# Patient Record
Sex: Male | Born: 1975 | Race: White | Hispanic: No | Marital: Single | Smoking: Never smoker
Health system: Southern US, Community
[De-identification: ages and names within clinical notes are randomized; demographics above are authoritative.]

## PROBLEM LIST (undated history)

## (undated) DIAGNOSIS — F329 Major depressive disorder, single episode, unspecified: Secondary | ICD-10-CM

## (undated) DIAGNOSIS — E785 Hyperlipidemia, unspecified: Secondary | ICD-10-CM

## (undated) DIAGNOSIS — F32A Depression, unspecified: Secondary | ICD-10-CM

## (undated) DIAGNOSIS — L729 Follicular cyst of the skin and subcutaneous tissue, unspecified: Secondary | ICD-10-CM

## (undated) HISTORY — DX: Major depressive disorder, single episode, unspecified: F32.9

## (undated) HISTORY — DX: Hyperlipidemia, unspecified: E78.5

## (undated) HISTORY — DX: Follicular cyst of the skin and subcutaneous tissue, unspecified: L72.9

## (undated) HISTORY — PX: CYSTECTOMY: SUR359

## (undated) HISTORY — DX: Depression, unspecified: F32.A

---

## 2012-04-02 ENCOUNTER — Encounter (INDEPENDENT_AMBULATORY_CARE_PROVIDER_SITE_OTHER): Payer: Self-pay | Admitting: Surgery

## 2012-04-21 ENCOUNTER — Ambulatory Visit (INDEPENDENT_AMBULATORY_CARE_PROVIDER_SITE_OTHER): Payer: BC Managed Care – PPO | Admitting: Surgery

## 2012-04-21 ENCOUNTER — Encounter (INDEPENDENT_AMBULATORY_CARE_PROVIDER_SITE_OTHER): Payer: Self-pay | Admitting: Surgery

## 2012-04-21 VITALS — BP 128/88 | HR 64 | Temp 98.8°F | Resp 14 | Ht 70.0 in | Wt 183.6 lb

## 2012-04-21 DIAGNOSIS — D234 Other benign neoplasm of skin of scalp and neck: Secondary | ICD-10-CM

## 2012-04-21 NOTE — Patient Instructions (Signed)

## 2012-04-21 NOTE — Progress Notes (Signed)
General Surgery [facility]  Chief Complaint  Patient presents with  . Cyst    new pt- eval cyst on scalp - referral from Dr. Tally Joe, [facility]    HISTORY: The patient is a 36 year old white male referred by his primary care physician for enlarging cystic lesions on the scalp. Patient has a history of sebaceous cyst being excised from the scalp and [place], [place].  Patient now notes for lesions on the scalp which are gradually enlarging and causing progressive discomfort. There is been no drainage. He has had no history of infection.  Patient has had a benign nevus excised from his torso as well as a sebaceous cyst from the back. He has had no history of cutaneous malignancy.  Past Medical History  Diagnosis Date  . Depression   . Hyperlipidemia   . Scalp cyst      Current Outpatient Prescriptions  Medication Sig Dispense Refill  . atorvastatin (LIPITOR) 10 MG tablet Take 10 mg by mouth daily.      . cetirizine (ZYRTEC) 10 MG tablet Take 10 mg by mouth daily.      . fluticasone (FLONASE) 50 MCG/ACT nasal spray Place 2 sprays into the nose daily.      Marland Kitchen omeprazole (PRILOSEC) 20 MG capsule Take 20 mg by mouth daily.      . sertraline (ZOLOFT) 25 MG tablet Take 25 mg by mouth daily.         Allergies  Allergen Reactions  . Cefaclor      Family History  Problem Relation Age of Onset  . Cancer Father     lymphoma     History   Social History  . Marital Status: Single    Spouse Name: N/A    Number of Children: N/A  . Years of Education: N/A   Social History Main Topics  . Smoking status: Never Smoker   . Smokeless tobacco: None  . Alcohol Use: Yes  . Drug Use: No  . Sexually Active:    Other Topics Concern  . None   Social History Narrative  . None     REVIEW OF SYSTEMS - PERTINENT POSITIVES ONLY: Enlarging cystic neoplasms of the scalp causing discomfort. No drainage. No history of infection. No history of  malignancy.  EXAM: Filed Vitals:   04/21/12 1629  BP: 128/88  Pulse: 64  Temp: 98.8 F (37.1 C)  Resp: 14    HEENT: normocephalic; pupils equal and reactive; sclerae clear; dentition good; mucous membranes moist SCALP: On the scalp there is a 1 cm cystic neoplasm in the right frontal position; there is a 1.5 cm mass in the left occipital region; there is a less than 1 cm lesion posterior left occipital region; there is a less than 1 cm lesion at the crown NECK:  symmetric on extension; no palpable anterior or posterior cervical lymphadenopathy; no supraclavicular masses; no tenderness CHEST: clear to auscultation bilaterally without rales, rhonchi, or wheezes CARDIAC: regular rate and rhythm without significant murmur; peripheral pulses are full EXT:  non-tender without edema; no deformity NEURO: no gross focal deficits; no sign of tremor   LABORATORY RESULTS: See [facility] (CHL-Epic) for most recent results   RADIOLOGY RESULTS: See [facility] (CHL-Epic) for most recent results   IMPRESSION: Multiple lesions on the scalp ranging in size up to 1.5 cm, symptomatic  PLAN: The patient would like to have these excised as they are beginning to cause discomfort. I believe we  can remove all 4 lesions under local anesthesia. We will close the incisions with nylon sutures which will be removed at 10 days. This can be done in the minor procedure room at the day surgery center.  The risks and benefits of the procedure have been discussed at length with the patient.  The patient understands the proposed procedure, potential alternative treatments, and the course of recovery to be expected.  All of the patient's questions have been answered at this time.  The patient wishes to proceed with surgery.  Velora Heckler, MD, FACS General & Endocrine Surgery [facility]   Visit Diagnoses: 1. Benign neoplasm of scalp     Primary Care Physician: Sissy Hoff,  MD

## 2012-05-09 ENCOUNTER — Ambulatory Visit (HOSPITAL_BASED_OUTPATIENT_CLINIC_OR_DEPARTMENT_OTHER): Admission: RE | Admit: 2012-05-09 | Payer: BC Managed Care – PPO | Source: Ambulatory Visit | Admitting: Surgery

## 2012-05-09 ENCOUNTER — Encounter (HOSPITAL_BASED_OUTPATIENT_CLINIC_OR_DEPARTMENT_OTHER): Admission: RE | Payer: Self-pay | Source: Ambulatory Visit

## 2012-05-09 SURGERY — MINOR EXCISION OF LESION
Anesthesia: LOCAL

## 2018-03-24 ENCOUNTER — Other Ambulatory Visit: Payer: Self-pay | Admitting: Family Medicine

## 2018-03-24 DIAGNOSIS — M7989 Other specified soft tissue disorders: Secondary | ICD-10-CM

## 2018-03-26 ENCOUNTER — Ambulatory Visit
Admission: RE | Admit: 2018-03-26 | Discharge: 2018-03-26 | Disposition: A | Discharge status: Home / Self Care | Payer: BC Managed Care – PPO | Source: Ambulatory Visit | Attending: Family Medicine | Admitting: Family Medicine

## 2018-03-26 ENCOUNTER — Other Ambulatory Visit: Payer: BC Managed Care – PPO

## 2018-03-26 DIAGNOSIS — M7989 Other specified soft tissue disorders: Secondary | ICD-10-CM

## 2018-09-20 IMAGING — US US SOFT TISSUE EXCLUDE HEAD/NECK
1 series · 14 of 25 positions shown · non-contrast
Comparison: None.

CLINICAL DATA: Soft tissue mass.

EXAM:
ULTRASOUND OF HEAD/NECK SOFT TISSUES
TECHNIQUE: Ultrasound examination of the head and neck soft tissues was
performed in the area of clinical concern.

[Series 1: us soft tissue exclude head/neck · 0.07mm/px · 29 acquisitions, 14 frames shown]
[im 1/29]
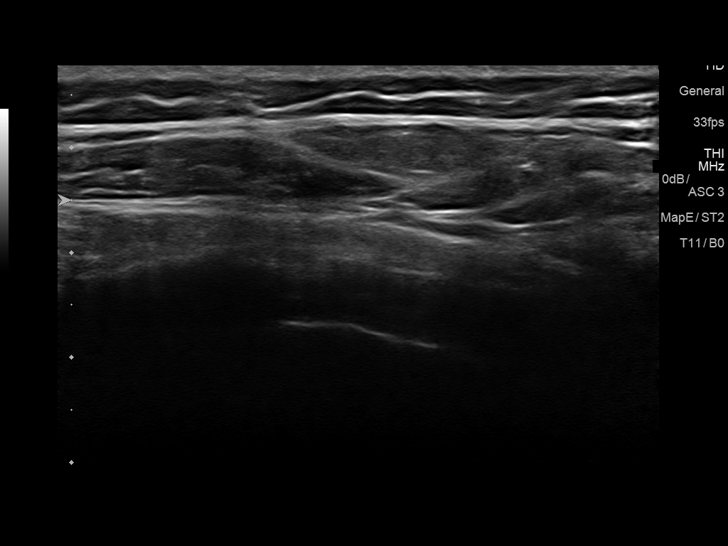
[im 3/29]
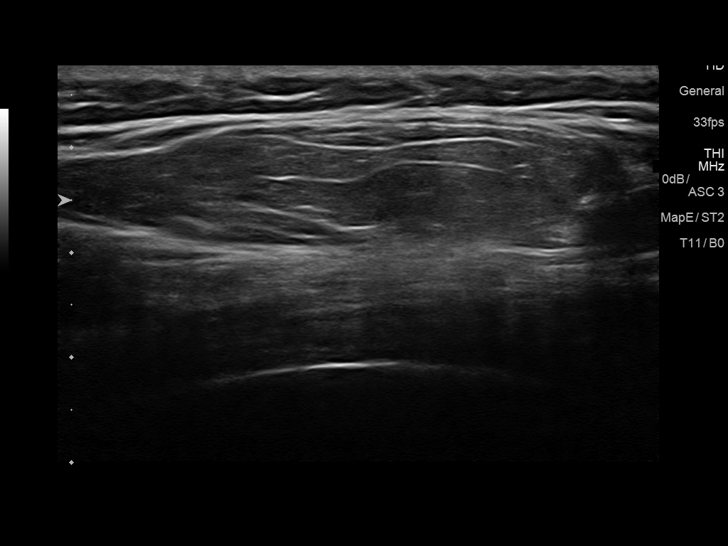
[im 5/29]
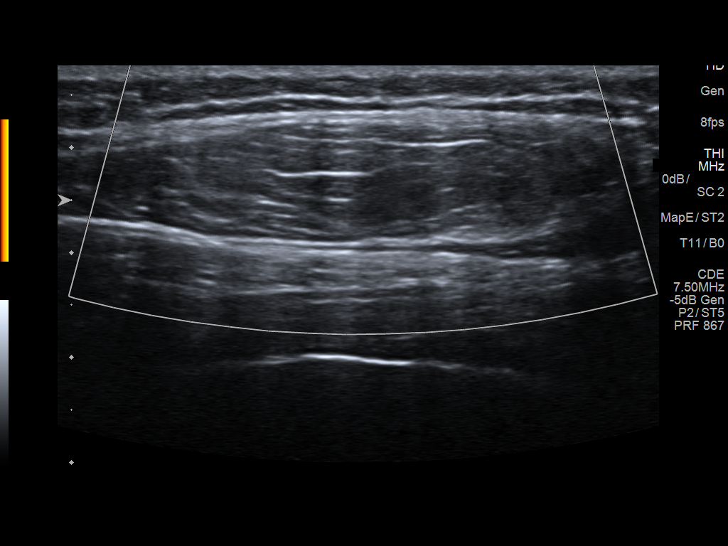
[im 8/29]
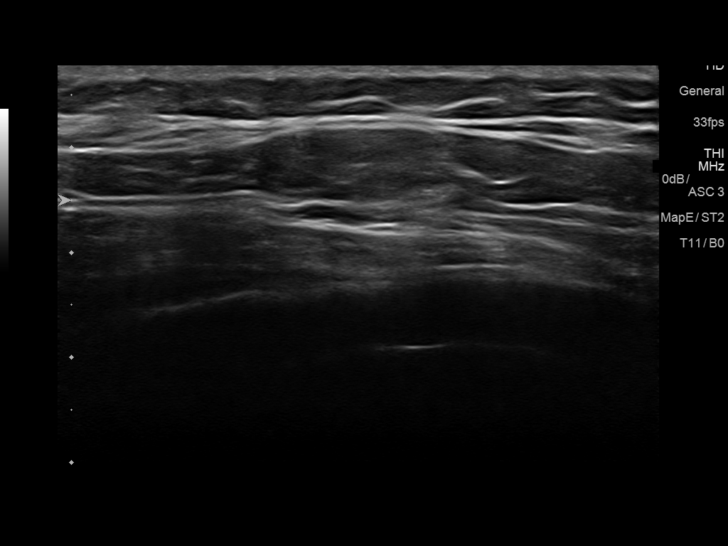
[im 10/29]
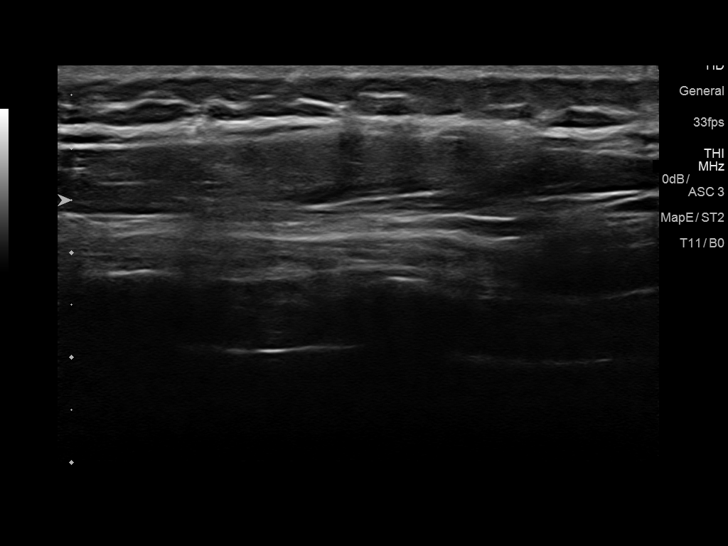
[im 11/29]
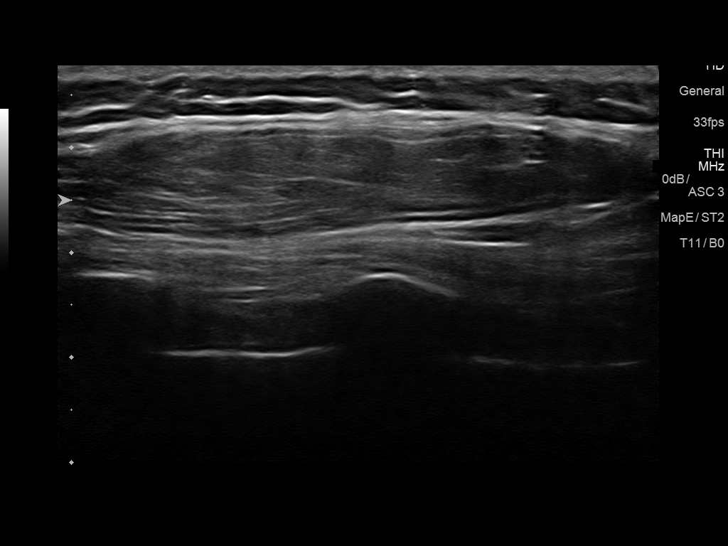
[im 13/29]
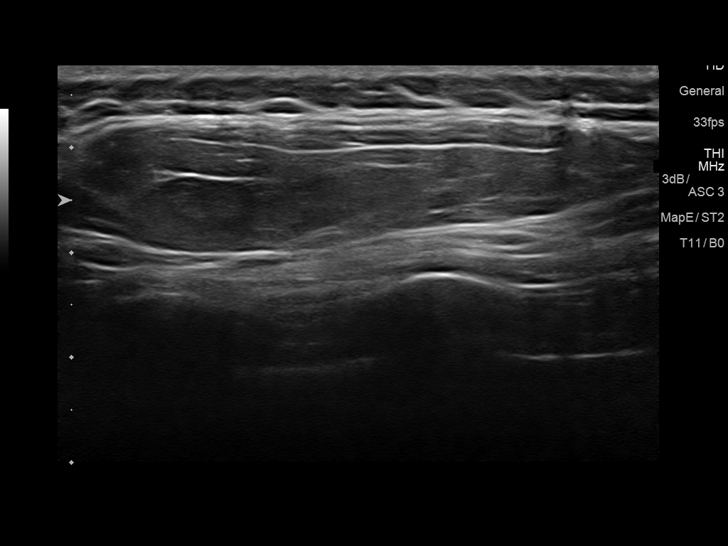
[im 16/29]
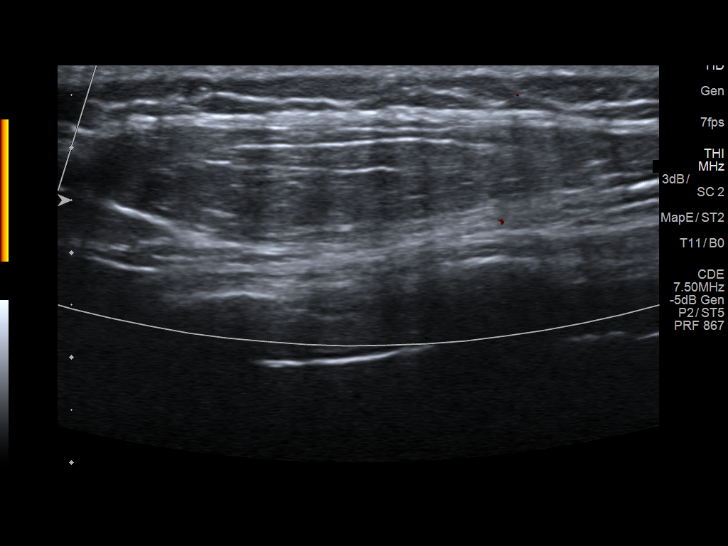
[im 18/29]
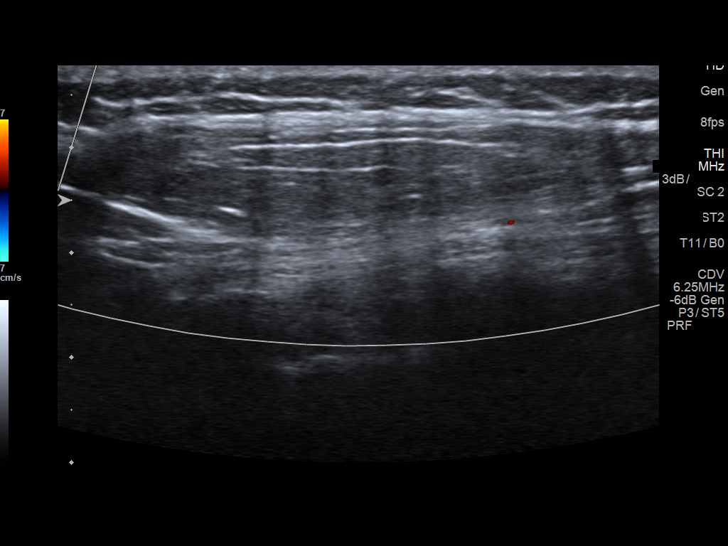
[im 19/29]
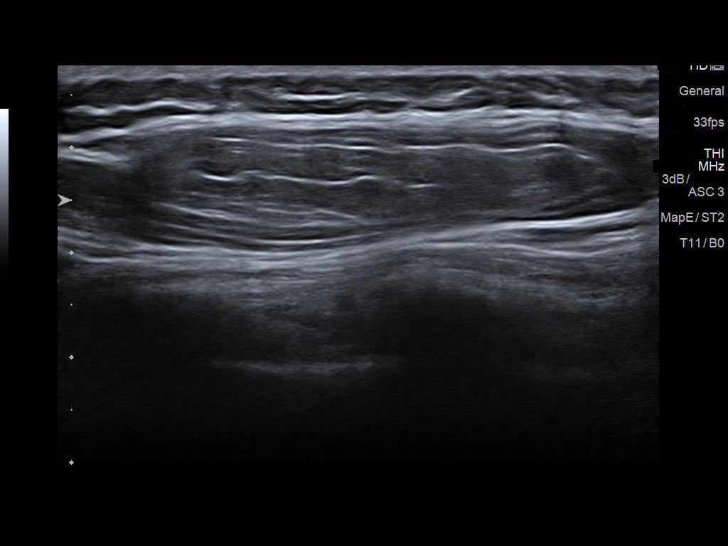
[im 22/29]
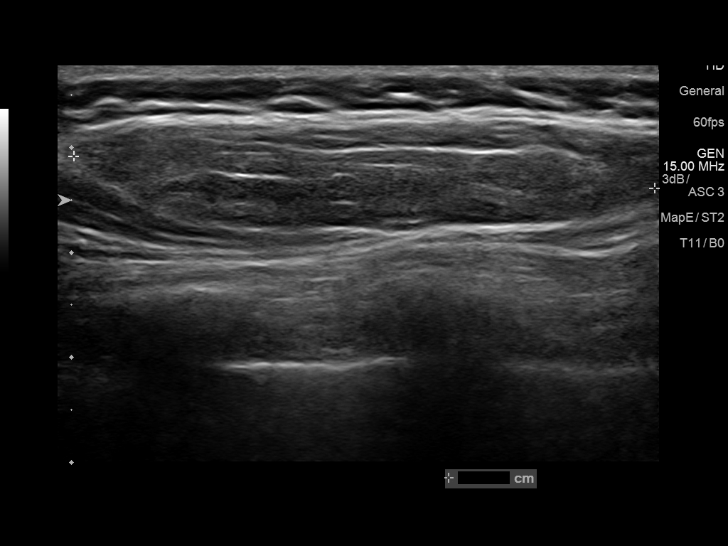
[im 24/29]
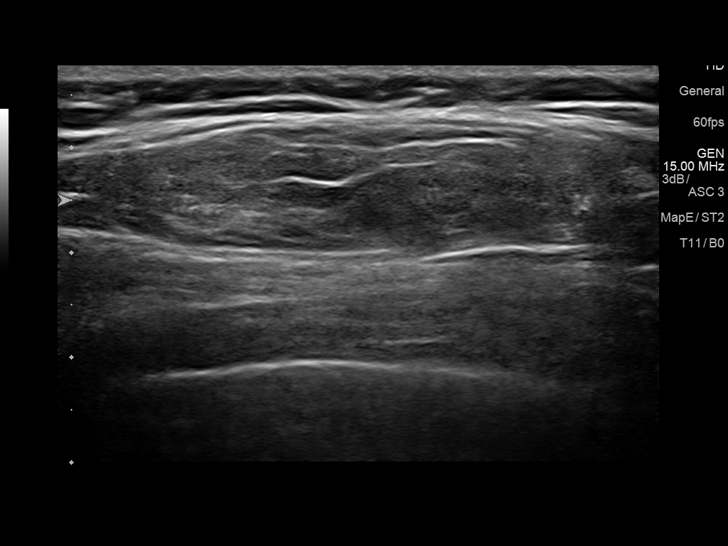
[im 26/29]
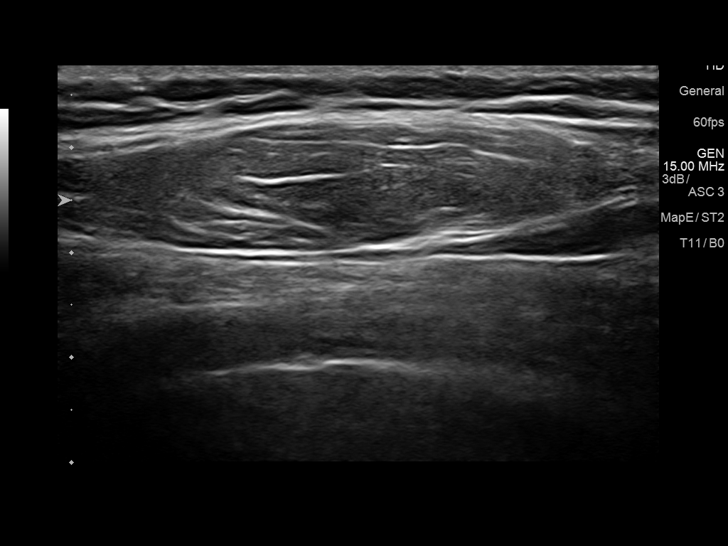
[im 29/29]
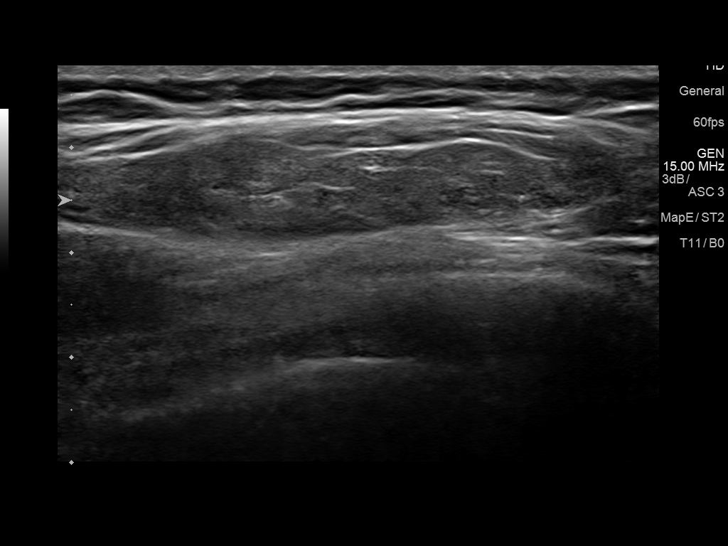

[14 of 25 positions shown; findings below may reference images not displayed]

FINDINGS: There is a 5.6 x 1.2 x 4.7 cm nearly isoechoic mass in the region of
the patient's lump.
IMPRESSION: The 5.6 x 1.2 x 4.7 cm mass is nonspecific but is statistically most
likely to represent a lipoma.

## 2019-12-24 ENCOUNTER — Ambulatory Visit: Payer: BC Managed Care – PPO | Attending: Internal Medicine

## 2019-12-24 DIAGNOSIS — Z23 Encounter for immunization: Secondary | ICD-10-CM

## 2019-12-24 NOTE — Progress Notes (Signed)
   Covid-19 Vaccination Clinic  Name:  [name]    MRN: [id number] DOB: [birth year]  12/24/2019  Derrick Little was observed post Covid-19 immunization for 15 minutes without incident. He was provided with Vaccine Information Sheet and instruction to access the V-Safe system.   Derrick Little was instructed to call 911 with any severe reactions post vaccine: Marland Kitchen Difficulty breathing  . Swelling of face and throat  . A fast heartbeat  . A bad rash all over body  . Dizziness and weakness   Immunizations Administered    Name Date Dose VIS Date Route   Pfizer COVID-19 Vaccine 12/24/2019  2:54 PM 0.3 mL 09/25/2019 Intramuscular   Manufacturer: [place]   Lot: [id number]   [place]: [id number]

## 2020-01-18 ENCOUNTER — Ambulatory Visit: Payer: BC Managed Care – PPO | Attending: Internal Medicine

## 2020-01-18 DIAGNOSIS — Z23 Encounter for immunization: Secondary | ICD-10-CM

## 2020-01-18 NOTE — Progress Notes (Signed)
   Covid-19 Vaccination Clinic  Name:  Derrick Little    MRN: [id number] DOB: June 07, 1976  01/18/2020  Mr. Derrick Little was observed post Covid-19 immunization for 15 minutes without incident. He was provided with Vaccine Information Sheet and instruction to access the V-Safe system.   Mr. Derrick Little was instructed to call 911 with any severe reactions post vaccine: Marland Kitchen Difficulty breathing  . Swelling of face and throat  . A fast heartbeat  . A bad rash all over body  . Dizziness and weakness   Immunizations Administered    Name Date Dose VIS Date Route   Pfizer COVID-19 Vaccine 01/18/2020  2:51 PM 0.3 mL 09/25/2019 Intramuscular   Manufacturer: [place]   Lot: [id number]   [place]: [id number]

## 2021-08-04 ENCOUNTER — Other Ambulatory Visit: Payer: Self-pay | Admitting: Family Medicine

## 2021-08-04 DIAGNOSIS — N5089 Other specified disorders of the male genital organs: Secondary | ICD-10-CM

## 2021-08-16 ENCOUNTER — Ambulatory Visit
Admission: RE | Admit: 2021-08-16 | Discharge: 2021-08-16 | Disposition: A | Discharge status: Home / Self Care | Payer: BC Managed Care – PPO | Source: Ambulatory Visit | Attending: Family Medicine | Admitting: Family Medicine

## 2021-08-16 DIAGNOSIS — N5089 Other specified disorders of the male genital organs: Secondary | ICD-10-CM

## 2022-05-12 IMAGING — US US SCROTUM
1 series · 14 of 25 positions shown · non-contrast
Comparison: None.

CLINICAL DATA: Right scrotal pain and swelling

EXAM:
SCROTAL ULTRASOUND
DOPPLER ULTRASOUND OF THE TESTICLES
TECHNIQUE: Complete ultrasound examination of the testicles, epididymis, and
other scrotal structures was performed. Color and spectral Doppler
ultrasound were also utilized to evaluate blood flow to the
testicles.

[Series 1: us scrotum · 43 acquisitions, 14 frames shown]
[im 1/43]
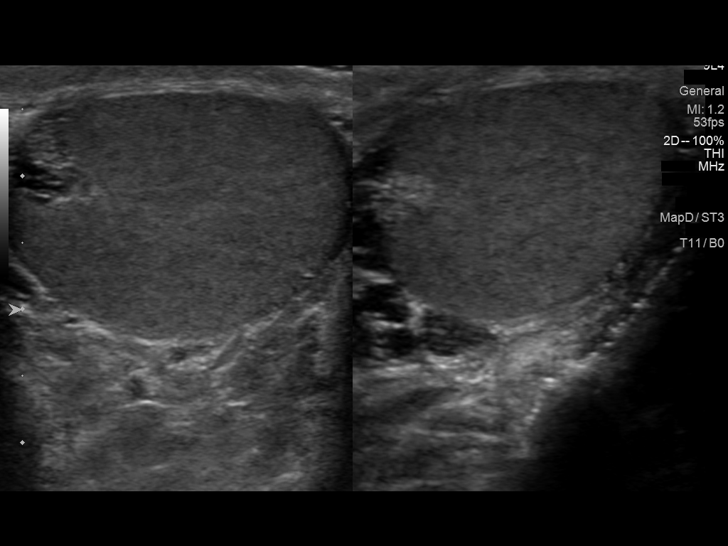
[im 4/43]
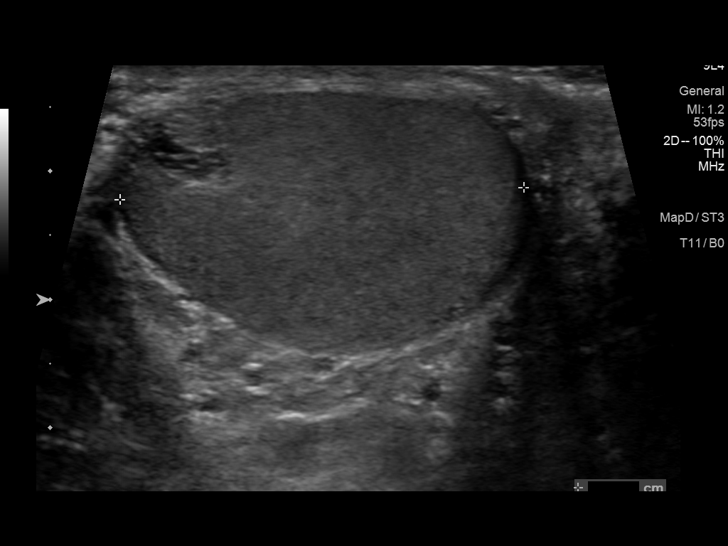
[im 8/43]
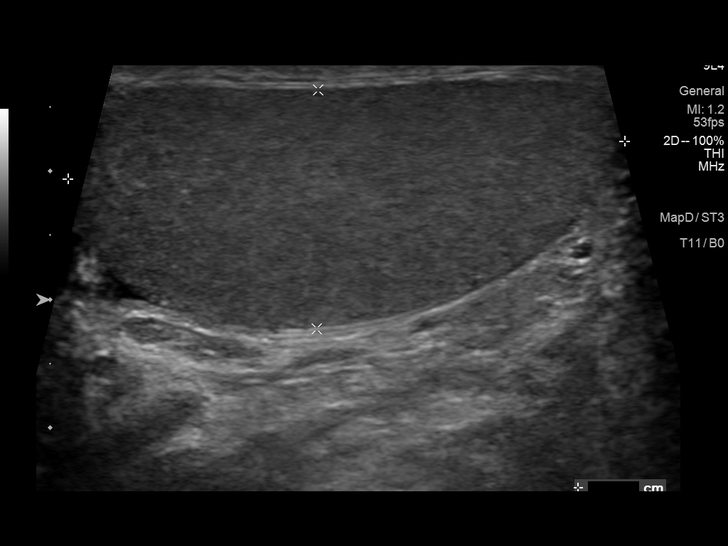
[im 11/43]
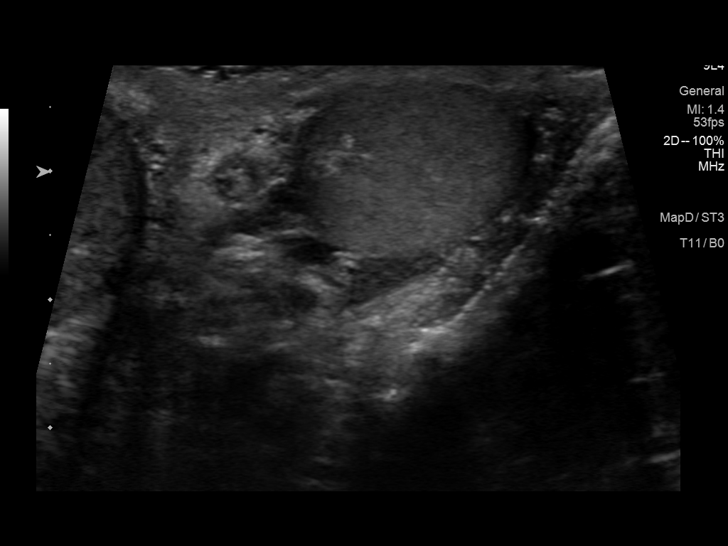
[im 15/43]
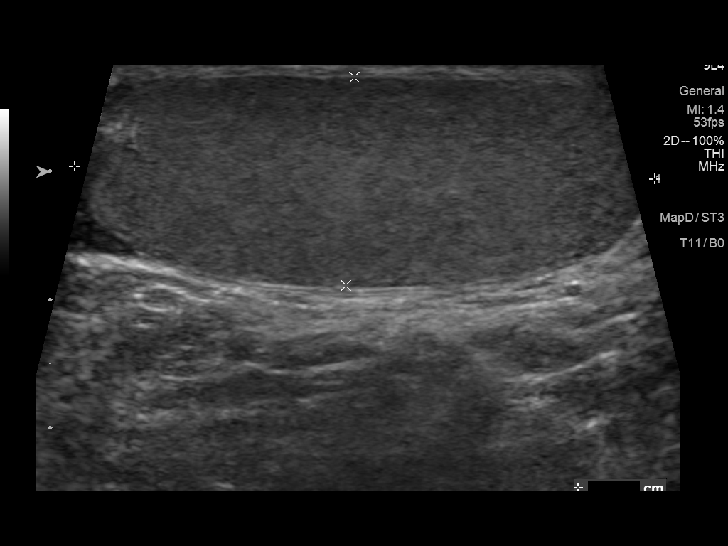
[im 16/43]
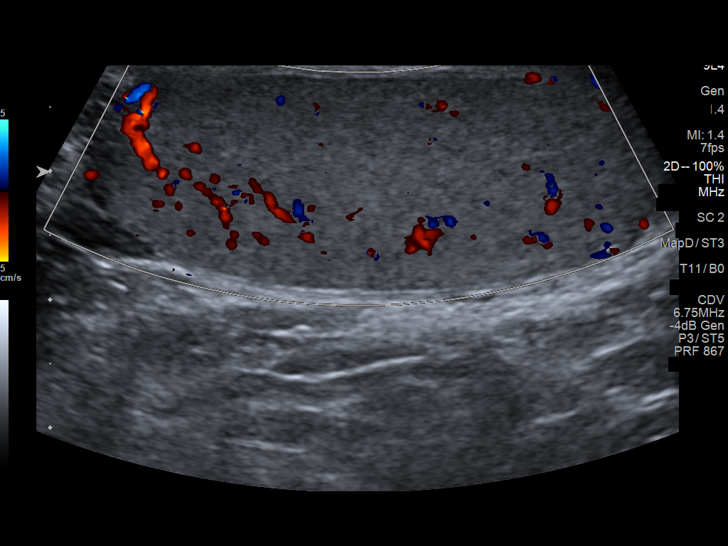
[im 20/43]
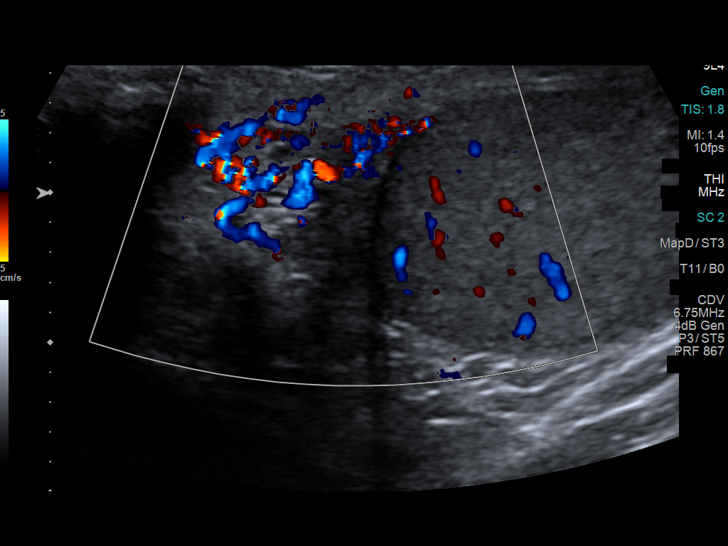
[im 23/43]
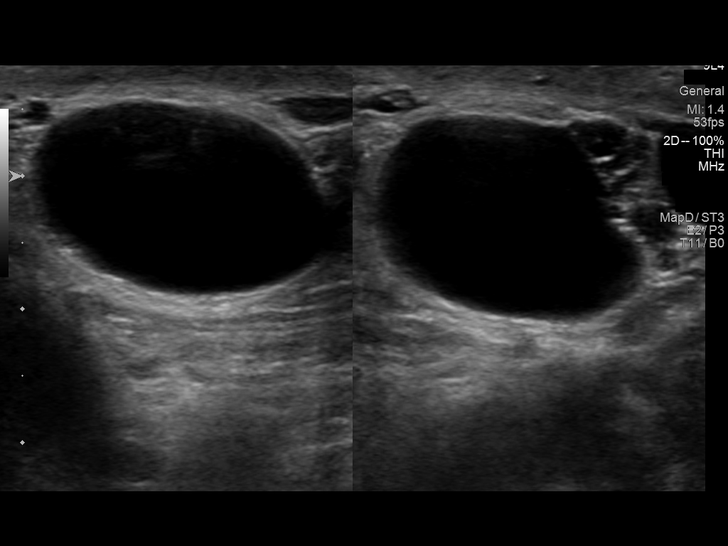
[im 27/43]
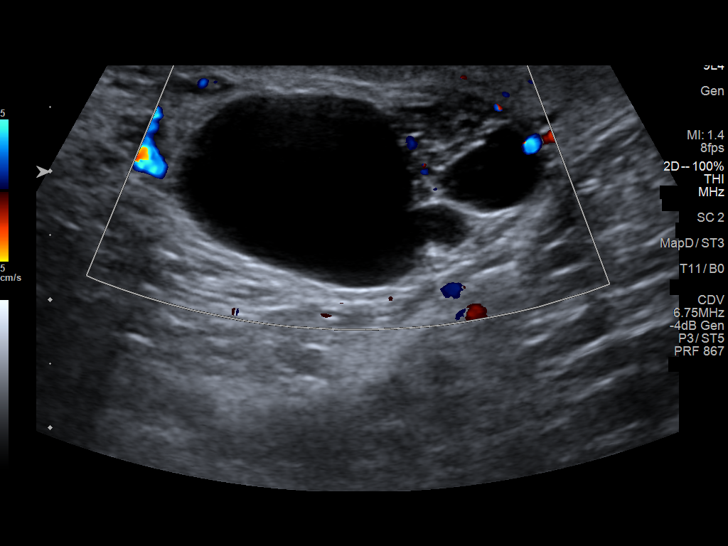
[im 29/43]
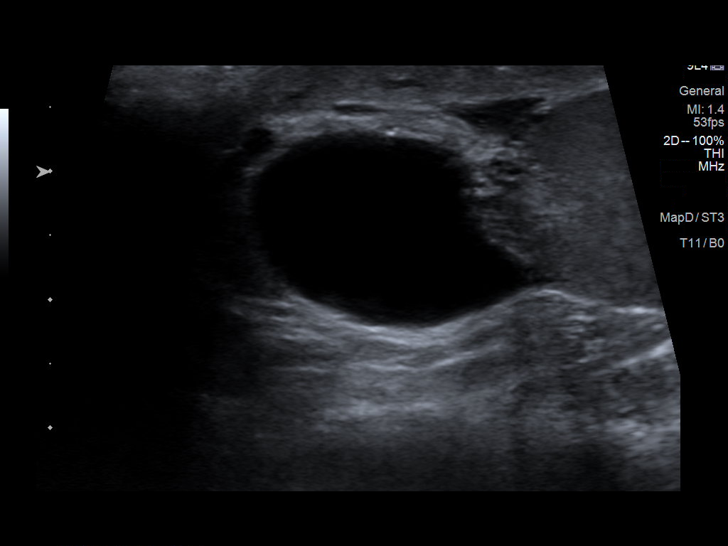
[im 32/43]
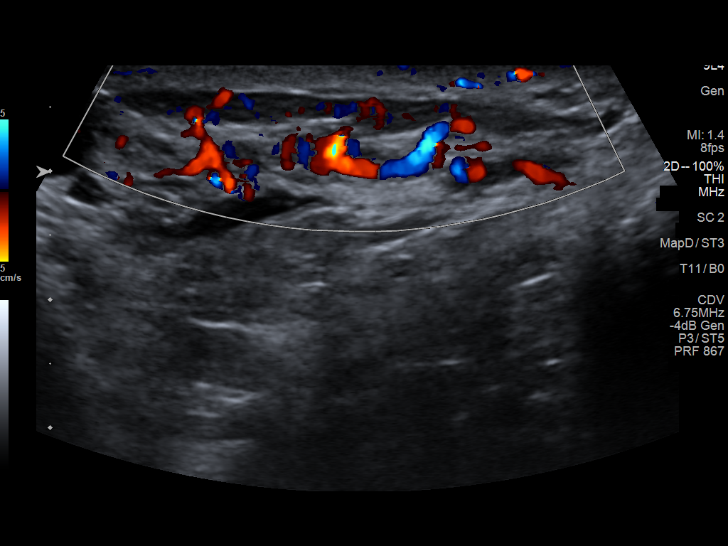
[im 36/43]
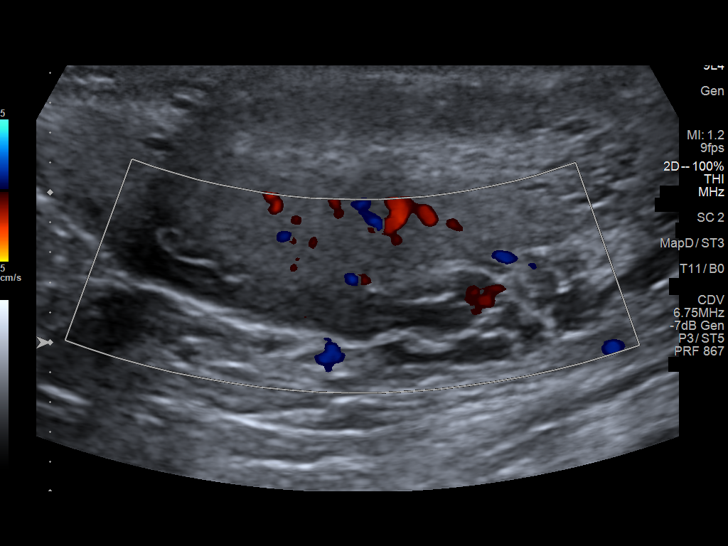
[im 39/43]
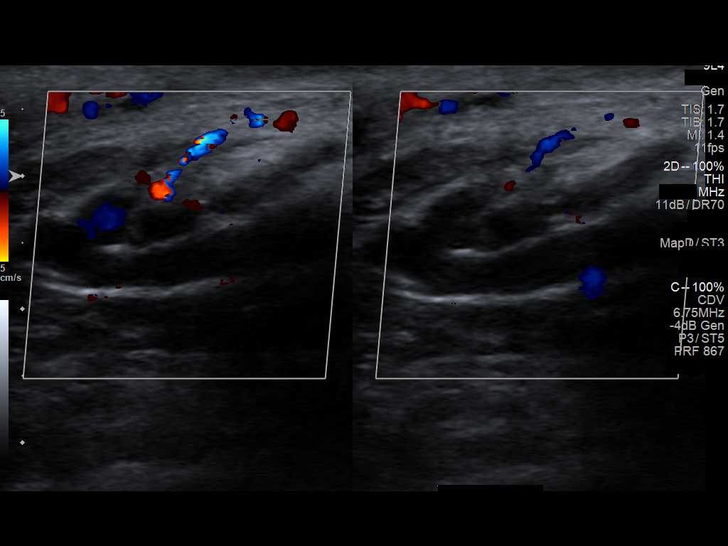
[im 43/43]
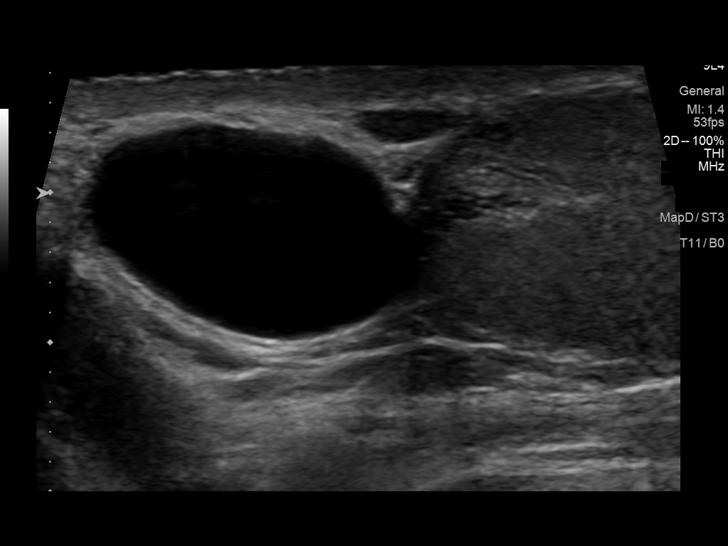

[14 of 25 positions shown; findings below may reference images not displayed]

FINDINGS: Right testicle

Measurements: 4.4 x 1.9 x 3.2 cm. No mass or microlithiasis
visualized.

Left testicle

Measurements:  4.5 x 1.6 x 2.5 cm.

Right epididymis: There are multiple cysts of varying sizes in the
right epididymis largest measuring 2.3 by 1.5 x 1.8 cm.

Left epididymis:  Normal in size and appearance.

Hydrocele:  None visualized.

Varicocele:  None visualized.

Pulsed Doppler interrogation of both testes demonstrates color flow
Doppler examination shows demonstrable flow in testis and epididymis
on both sides.
IMPRESSION: There is homogeneous echogenicity in both testes. There is vascular
flow in both testes. There are multiple cysts of varying sizes in
the right epididymis largest measuring 2.3 cm in maximum diameter.
The right epididymal cysts correspond to the area of patient's
symptoms.
